# Patient Record
Sex: Female | Born: 2000 | Marital: Single | Smoking: Never smoker
Health system: Southern US, Community
[De-identification: ages and names within clinical notes are randomized; demographics above are authoritative.]

---
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
## 2020-01-22 ENCOUNTER — Encounter: Payer: Self-pay | Admitting: *Deleted

## 2020-01-22 ENCOUNTER — Other Ambulatory Visit: Payer: Self-pay

## 2020-01-22 ENCOUNTER — Emergency Department
Admission: EM | Admit: 2020-01-22 | Discharge: 2020-01-22 | Disposition: A | Discharge status: Home / Self Care | Payer: Self-pay | Attending: Emergency Medicine | Admitting: Emergency Medicine

## 2020-01-22 DIAGNOSIS — R112 Nausea with vomiting, unspecified: Secondary | ICD-10-CM | POA: Insufficient documentation

## 2020-01-22 DIAGNOSIS — R000 Tachycardia, unspecified: Secondary | ICD-10-CM | POA: Insufficient documentation

## 2020-01-22 LAB — COMPREHENSIVE METABOLIC PANEL
ALT: 30 U/L (ref 0–44)
AST: 30 U/L (ref 15–41)
Albumin: 4.4 g/dL (ref 3.5–5.0)
Alkaline Phosphatase: 60 U/L (ref 38–126)
Anion gap: 14 (ref 5–15)
BUN: 13 mg/dL (ref 6–20)
CO2: 25 mmol/L (ref 22–32)
Calcium: 9.6 mg/dL (ref 8.9–10.3)
Chloride: 101 mmol/L (ref 98–111)
Creatinine, Ser: 0.72 mg/dL (ref 0.44–1.00)
GFR, Estimated: >60 mL/min (ref >60)
Glucose, Bld: 149 mg/dL — ABNORMAL HIGH (ref 70–99)
Potassium: 3.8 mmol/L (ref 3.5–5.1)
Sodium: 140 mmol/L (ref 135–145)
Total Bilirubin: 0.6 mg/dL (ref 0.3–1.2)
Total Protein: 8.4 g/dL — ABNORMAL HIGH (ref 6.5–8.1)

## 2020-01-22 LAB — CBC
HCT: 43 % (ref 36.0–46.0)
Hemoglobin: 15 g/dL (ref 12.0–15.0)
MCH: 30.1 pg (ref 26.0–34.0)
MCHC: 34.9 g/dL (ref 30.0–36.0)
MCV: 86.2 fL (ref 80.0–100.0)
Platelets: 393 10*3/uL (ref 150–400)
RBC: 4.99 MIL/uL (ref 3.87–5.11)
RDW: 11.8 % (ref 11.5–15.5)
WBC: 13.6 10*3/uL — ABNORMAL HIGH (ref 4.0–10.5)
nRBC: 0 % (ref 0.0–0.2)

## 2020-01-22 LAB — LIPASE, BLOOD: Lipase: 36 U/L (ref 11–51)

## 2020-01-22 MED ORDER — ONDANSETRON 4 MG PO TBDP
4.0000 mg | ORAL_TABLET | Freq: Once | ORAL | Status: AC
  Administered 2020-01-22: 4 mg via ORAL

## 2020-01-22 MED ORDER — ONDANSETRON 4 MG PO TBDP
ORAL_TABLET | ORAL | Status: AC
  Filled 2020-01-22: qty 1

## 2020-01-22 NOTE — ED Provider Notes (Signed)
[facility] Emergency Department Provider Note   ____________________________________________    I have reviewed the triage vital signs and the nursing notes.   HISTORY  Chief Complaint Emesis     HPI Hannah Walter is a 20 y.o. female who presents with complaints of nausea and vomiting.  Patient reports she had what she believes were marijuana edibles, she ate 2 of them and shortly thereafter developed nausea and vomiting.  She has vomited multiple times.  She denies abdominal pain.  Has never had an edible before.  Denies fevers or chills.  No sick contacts reported.  Has not take anything for this besides Zofran in the waiting room  History reviewed. No pertinent past medical history.  There are no problems to display for this patient.   History reviewed. No pertinent surgical history.  Prior to Admission medications   Not on File     Allergies Patient has no known allergies.  History reviewed. No pertinent family history.  Social History Social History   Tobacco Use  . Smoking status: Never Smoker  . Smokeless tobacco: Never Used  Substance Use Topics  . Alcohol use: No  . Drug use: Yes    Types: Marijuana    Review of Systems  Constitutional: No fever/chills Eyes: No visual changes.  ENT: No sore throat. Cardiovascular: Denies chest pain. Respiratory: Denies shortness of breath. Gastrointestinal: As above Genitourinary: Negative for dysuria. Musculoskeletal: Negative for back pain. Skin: Negative for rash. Neurological: Negative for headaches or weakness   ____________________________________________   PHYSICAL EXAM:  VITAL SIGNS: ED Triage Vitals  Enc Vitals Group     BP 01/22/20 1618 116/77     Pulse Rate 01/22/20 1618 (!) 139     Resp 01/22/20 1618 16     Temp 01/22/20 1618 (!) 97.5 F (36.4 C)     Temp Source 01/22/20 1618 Axillary     SpO2 01/22/20 1618 98 %     Weight 01/22/20 1619 47.6 kg (105 lb)      Height 01/22/20 1619 1.448 m (4\' 9" )     Head Circumference --      Peak Flow --      Pain Score 01/22/20 1618 0     Pain Loc --      Pain Edu? --      Excl. in GC? --     Constitutional: Alert and oriented.   Nose: No congestion/rhinnorhea. Mouth/Throat: Mucous membranes are moist.    Cardiovascular: Mild tachycardia, regular rhythm. Grossly normal heart sounds.  Good peripheral circulation. Respiratory: Normal respiratory effort.  No retractions. Lungs CTAB. Gastrointestinal: Soft and nontender. No distention.  No CVA tenderness.  Reassuring exam  Musculoskeletal: No lower extremity tenderness nor edema.  Warm and well perfused Neurologic:  Normal speech and language. No gross focal neurologic deficits are appreciated.  Skin:  Skin is warm, dry and intact. No rash noted. Psychiatric: Mood and affect are normal. Speech and behavior are normal.  ____________________________________________   LABS (all labs ordered are listed, but only abnormal results are displayed)  Labs Reviewed  COMPREHENSIVE METABOLIC PANEL - Abnormal; Notable for the following components:      Result Value   Glucose, Bld 149 (*)    Total Protein 8.4 (*)    All other components within normal limits  CBC - Abnormal; Notable for the following components:   WBC 13.6 (*)    All other components within normal limits  LIPASE, BLOOD  URINALYSIS, COMPLETE (UACMP)  WITH MICROSCOPIC  POC URINE PREG, ED   ____________________________________________  EKG   ____________________________________________  RADIOLOGY   ____________________________________________   PROCEDURES  Procedure(s) performed: No  Procedures   Critical Care performed: No ____________________________________________   INITIAL IMPRESSION / ASSESSMENT AND PLAN / ED COURSE  Pertinent labs & imaging results that were available during my care of the patient were reviewed by me and considered in my medical decision making  (see chart for details).  Patient with nausea vomiting, tachycardia likely related to vomiting related to marijuana edible usage.  Lab work today is reassuring, mildly elevated white blood cell count likely related to vomiting, will treat with ODT Zofran and observed in the department, will reevaluate ----------------------------------------- 6:40 PM on 01/22/2020 -----------------------------------------   Patient feeling much better, no further nausea or vomiting, appropriate for discharge at this time    ____________________________________________   FINAL CLINICAL IMPRESSION(S) / ED DIAGNOSES  Final diagnoses:  Non-intractable vomiting with nausea, unspecified vomiting type        Note:  This document was prepared using Dragon voice recognition software and may include unintentional dictation errors.   Jene Every, MD 01/22/20 1840

## 2020-01-22 NOTE — ED Triage Notes (Signed)
FIRST NURSE: Pt took edibles with friends and now feels bad.  NAD noted at present.

## 2020-01-22 NOTE — ED Notes (Signed)
Pt no longer vomiting. HR now 107

## 2020-01-22 NOTE — ED Triage Notes (Signed)
Pt to ED reporting she had marijuana edibles for the first time today and has been vomiting since. Pt has vomited multiple times in the ED. No other distress noted at this time.

## 2020-01-22 NOTE — ED Notes (Signed)
RED TOP SENT WITH BLOOD
